# Patient Record
Sex: Female | Born: 1960 | Race: White | Hispanic: No | Marital: Single | Smoking: Never smoker
Health system: Southern US, Community
[De-identification: ages and names within clinical notes are randomized; demographics above are authoritative.]

## PROBLEM LIST (undated history)

## (undated) DIAGNOSIS — K219 Gastro-esophageal reflux disease without esophagitis: Secondary | ICD-10-CM

## (undated) DIAGNOSIS — I499 Cardiac arrhythmia, unspecified: Secondary | ICD-10-CM

## (undated) DIAGNOSIS — M1990 Unspecified osteoarthritis, unspecified site: Secondary | ICD-10-CM

## (undated) DIAGNOSIS — Z87442 Personal history of urinary calculi: Secondary | ICD-10-CM

## (undated) DIAGNOSIS — I10 Essential (primary) hypertension: Secondary | ICD-10-CM

## (undated) DIAGNOSIS — E119 Type 2 diabetes mellitus without complications: Secondary | ICD-10-CM

## (undated) HISTORY — PX: DILATION AND CURETTAGE OF UTERUS: SHX78

## (undated) HISTORY — PX: EYE SURGERY: SHX253

---

## 2004-01-28 ENCOUNTER — Other Ambulatory Visit: Payer: Self-pay

## 2005-03-11 ENCOUNTER — Ambulatory Visit: Payer: Self-pay

## 2005-04-15 ENCOUNTER — Ambulatory Visit: Payer: Self-pay

## 2005-12-22 ENCOUNTER — Other Ambulatory Visit: Payer: Self-pay

## 2005-12-22 ENCOUNTER — Emergency Department: Payer: Self-pay | Admitting: Unknown Physician Specialty

## 2006-08-26 ENCOUNTER — Ambulatory Visit: Payer: Self-pay | Admitting: Internal Medicine

## 2007-05-11 ENCOUNTER — Encounter (INDEPENDENT_AMBULATORY_CARE_PROVIDER_SITE_OTHER): Payer: Self-pay | Admitting: Gynecology

## 2007-05-11 ENCOUNTER — Ambulatory Visit: Payer: Self-pay | Admitting: Gynecology

## 2007-06-27 IMAGING — US ABDOMEN ULTRASOUND
1 series · 17 of 25 positions shown · non-contrast
Comparison: none

REASON FOR EXAM: Abdominal pain RUQ, evaluate gallbladder
COMMENTS:

[Series 1: abdomen ultrasound · 17 of 37 slices shown]
[im 1/37]
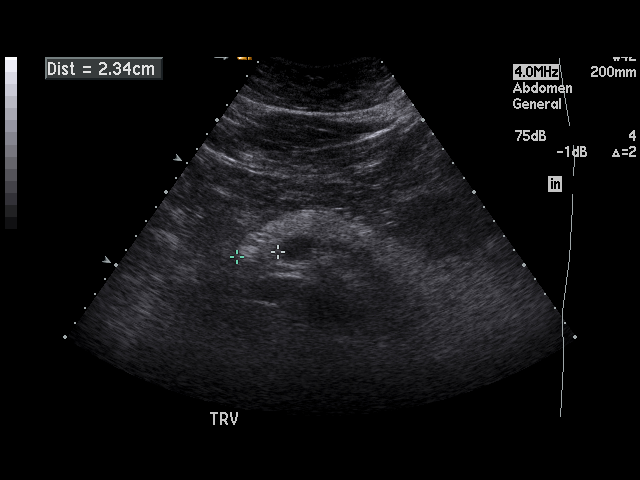
[im 4/37]
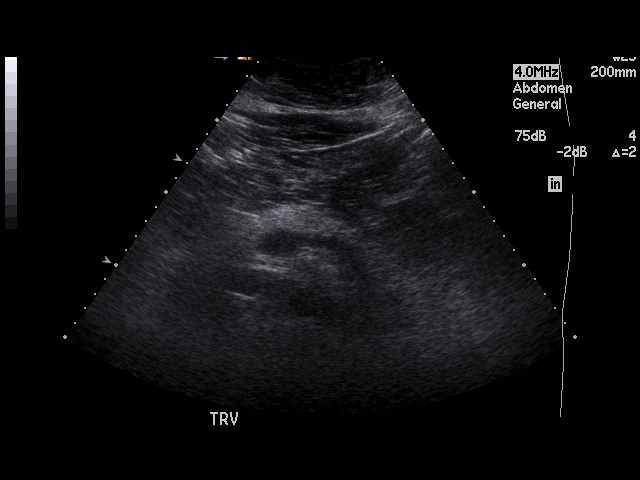
[im 5/37]
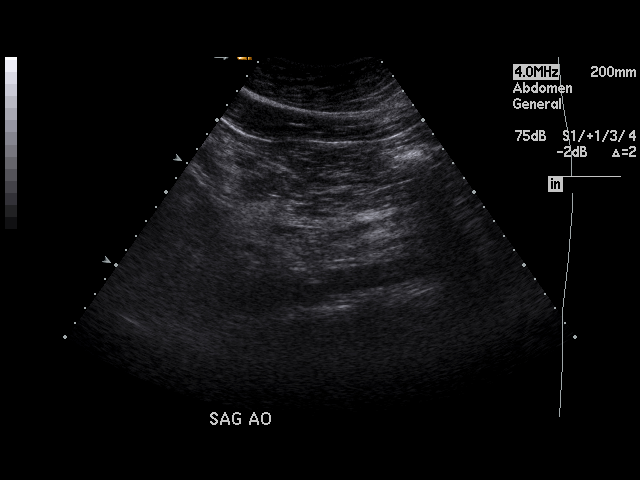
[im 8/37]
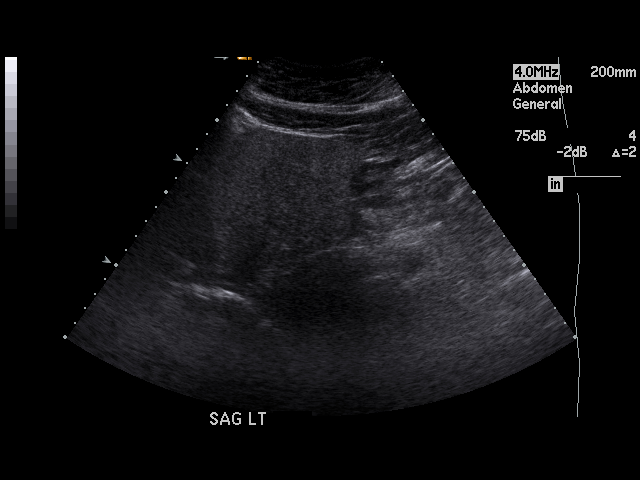
[im 10/37]
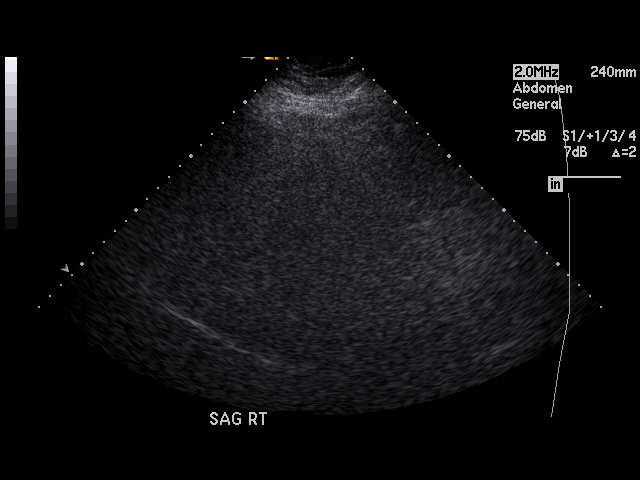
[im 13/37]
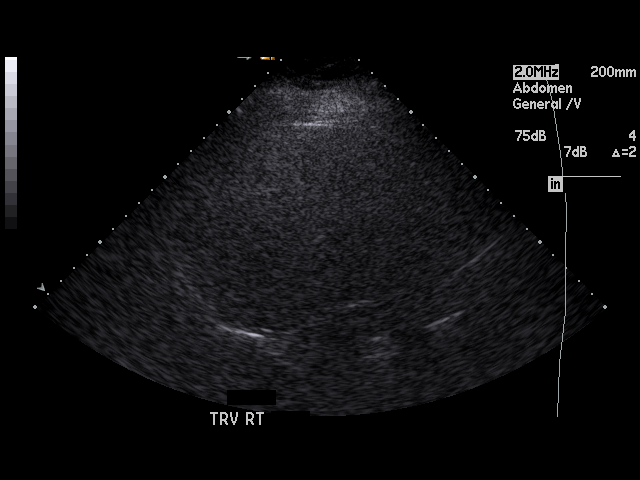
[im 14/37]
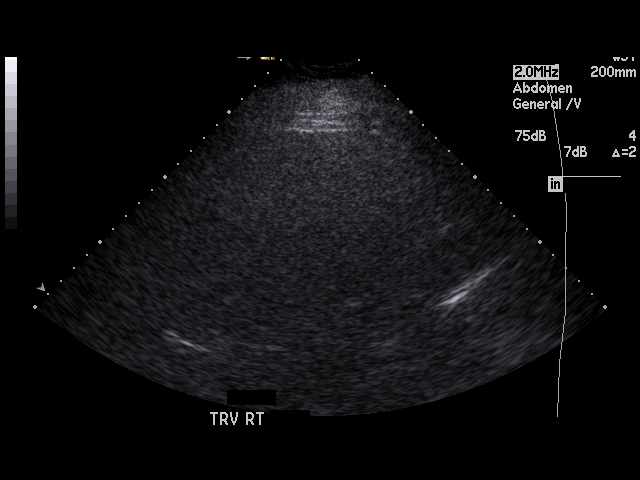
[im 17/37]
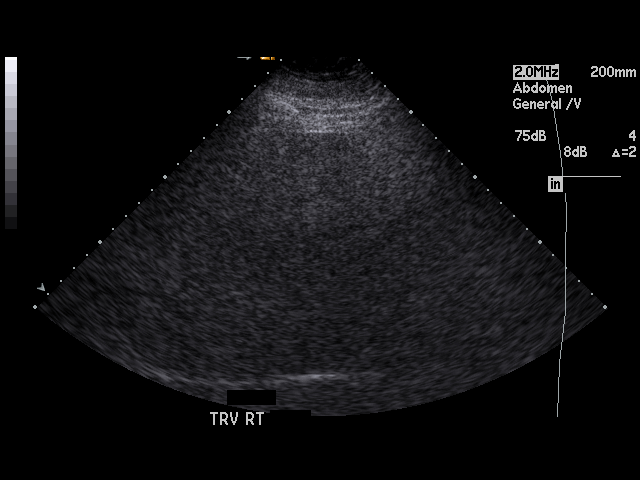
[im 19/37]
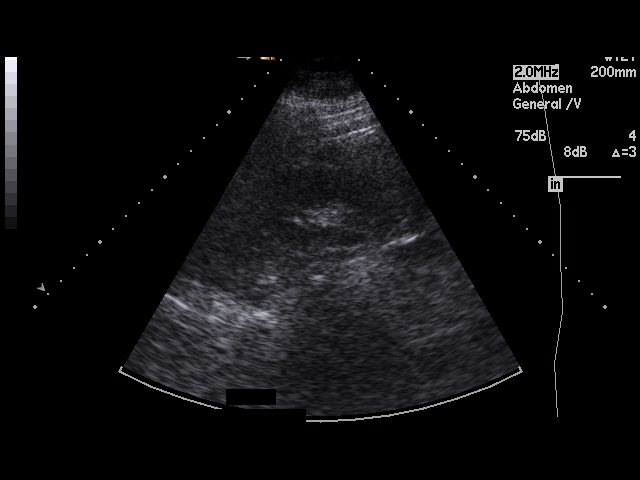
[im 20/37]
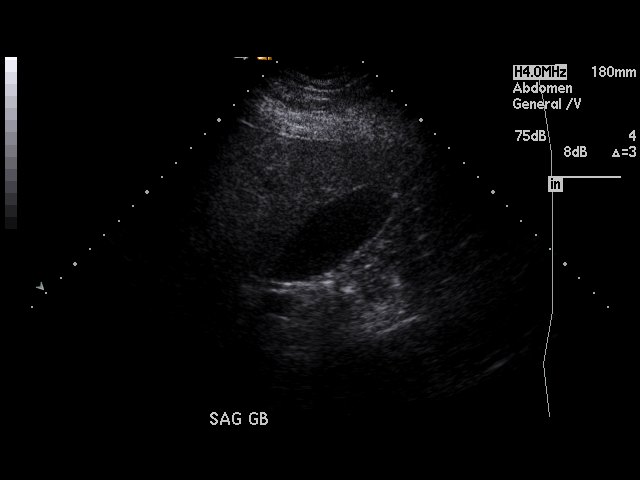
[im 23/37]
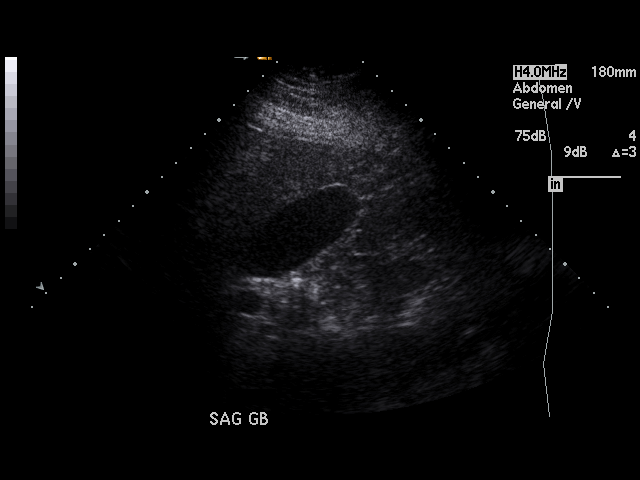
[im 25/37]
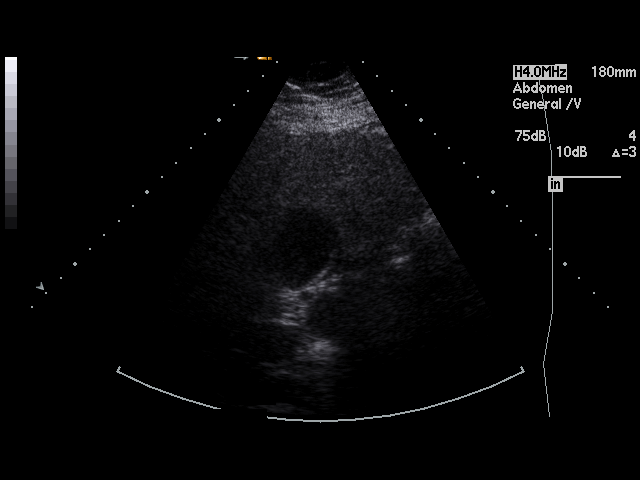
[im 28/37]
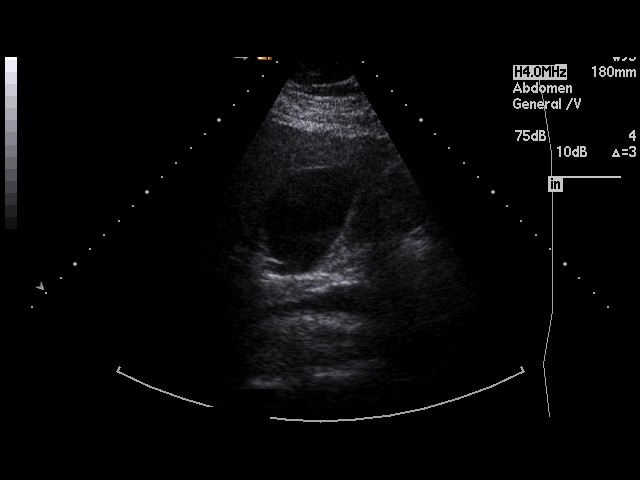
[im 29/37]
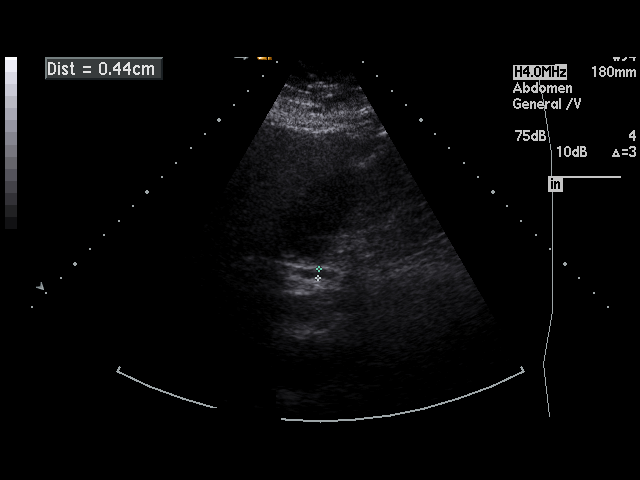
[im 32/37]
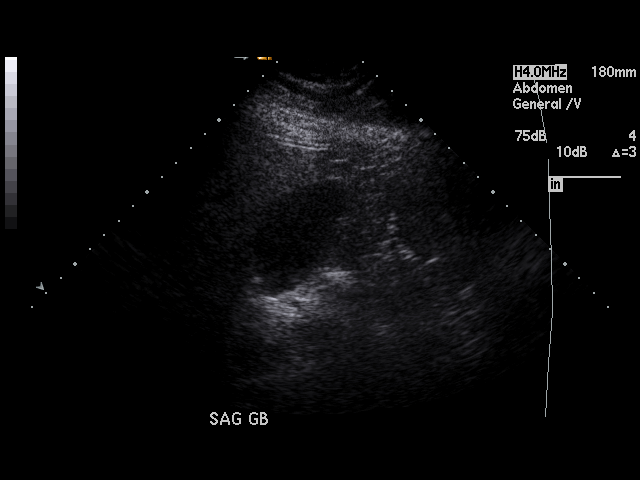
[im 34/37]
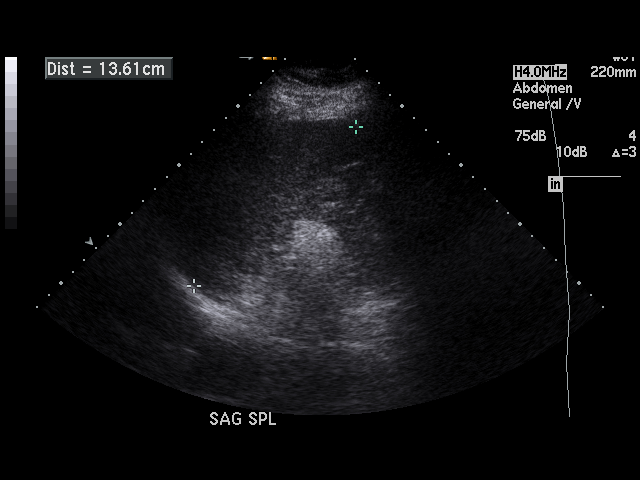
[im 37/37]
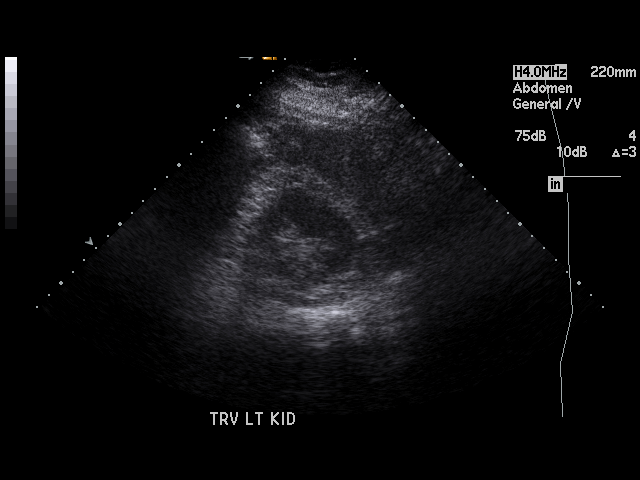

[17 of 25 positions shown; findings below may reference images not displayed]

PROCEDURE:     US  - US ABDOMEN GENERAL SURVEY  - March 11, 2005  [DATE]

RESULT:     The liver is dense, suspicious for fatty infiltration.  The
spleen is upper limits for normal in size.  The spleen measures 13.6 cm in
AP diameter.  The pancreas is normal in appearance.  No gallstones are seen.
 There is no thickening of the gallbladder wall.  Common bile duct measures
4.5 mm in diameter which is within normal limits.  The kidneys show no
hydronephrosis.
IMPRESSION: Probable fatty infiltration of the liver.

No gallstones are seen.

## 2007-08-30 ENCOUNTER — Ambulatory Visit: Payer: Self-pay | Admitting: Gynecology

## 2008-12-19 ENCOUNTER — Ambulatory Visit: Payer: Self-pay | Admitting: Internal Medicine

## 2009-11-13 ENCOUNTER — Ambulatory Visit: Payer: Self-pay

## 2009-11-18 ENCOUNTER — Ambulatory Visit: Payer: Self-pay | Admitting: Cardiovascular Disease

## 2009-11-19 ENCOUNTER — Ambulatory Visit: Payer: Self-pay

## 2009-12-18 ENCOUNTER — Encounter: Payer: Self-pay | Admitting: Gastroenterology

## 2010-01-14 ENCOUNTER — Ambulatory Visit: Payer: Self-pay | Admitting: Internal Medicine

## 2010-02-26 ENCOUNTER — Ambulatory Visit: Payer: Self-pay | Admitting: Internal Medicine

## 2010-07-29 NOTE — Medication Information (Signed)
Summary: Tax adviser   Imported By: Rosine Beat 12/18/2009 10:41:16  _____________________________________________________________________  External Attachment:    Type:   Image     Comment:   External Document

## 2012-06-13 IMAGING — CR DG ABDOMEN 2V
1 series · 3 of 3 positions shown · non-contrast
Comparison: none

REASON FOR EXAM: Re: rt side stones
COMMENTS:

[Series 1: view not recorded · 0.17mm/px · 3 of 3 slices shown]
[im 1/3]
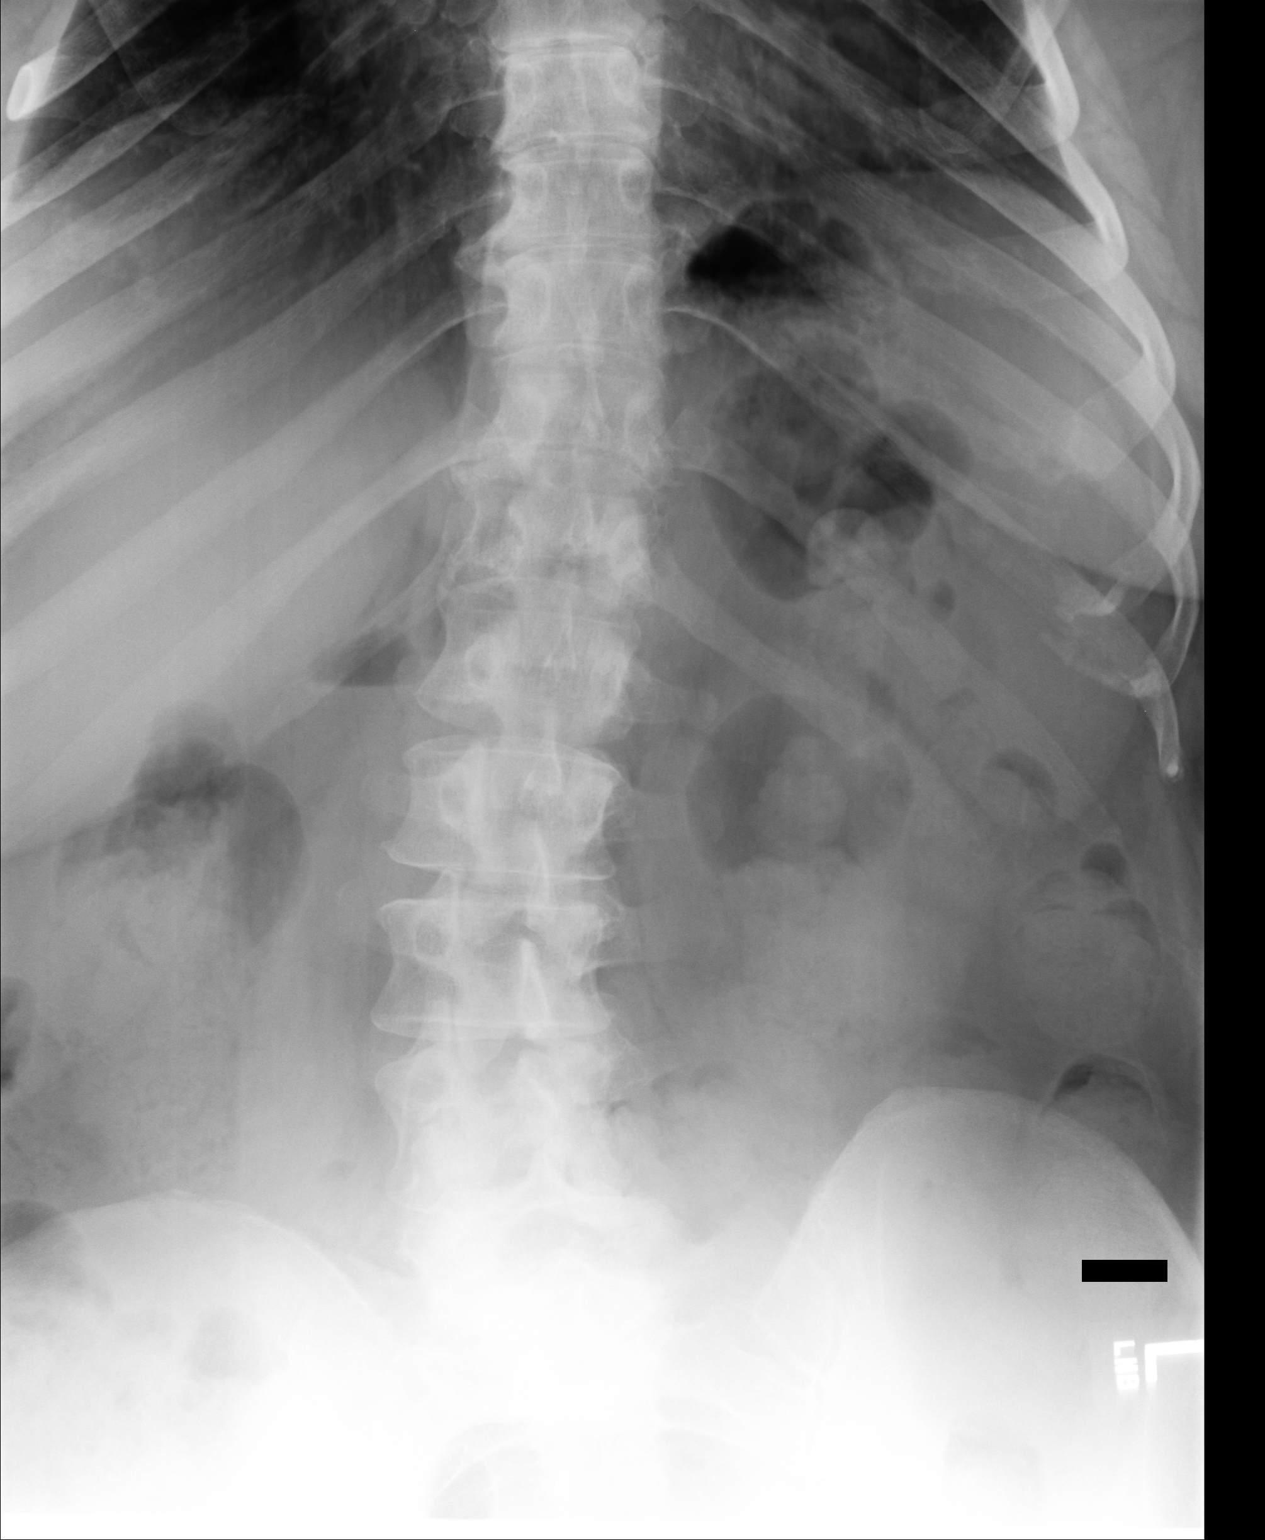
[im 2/3]
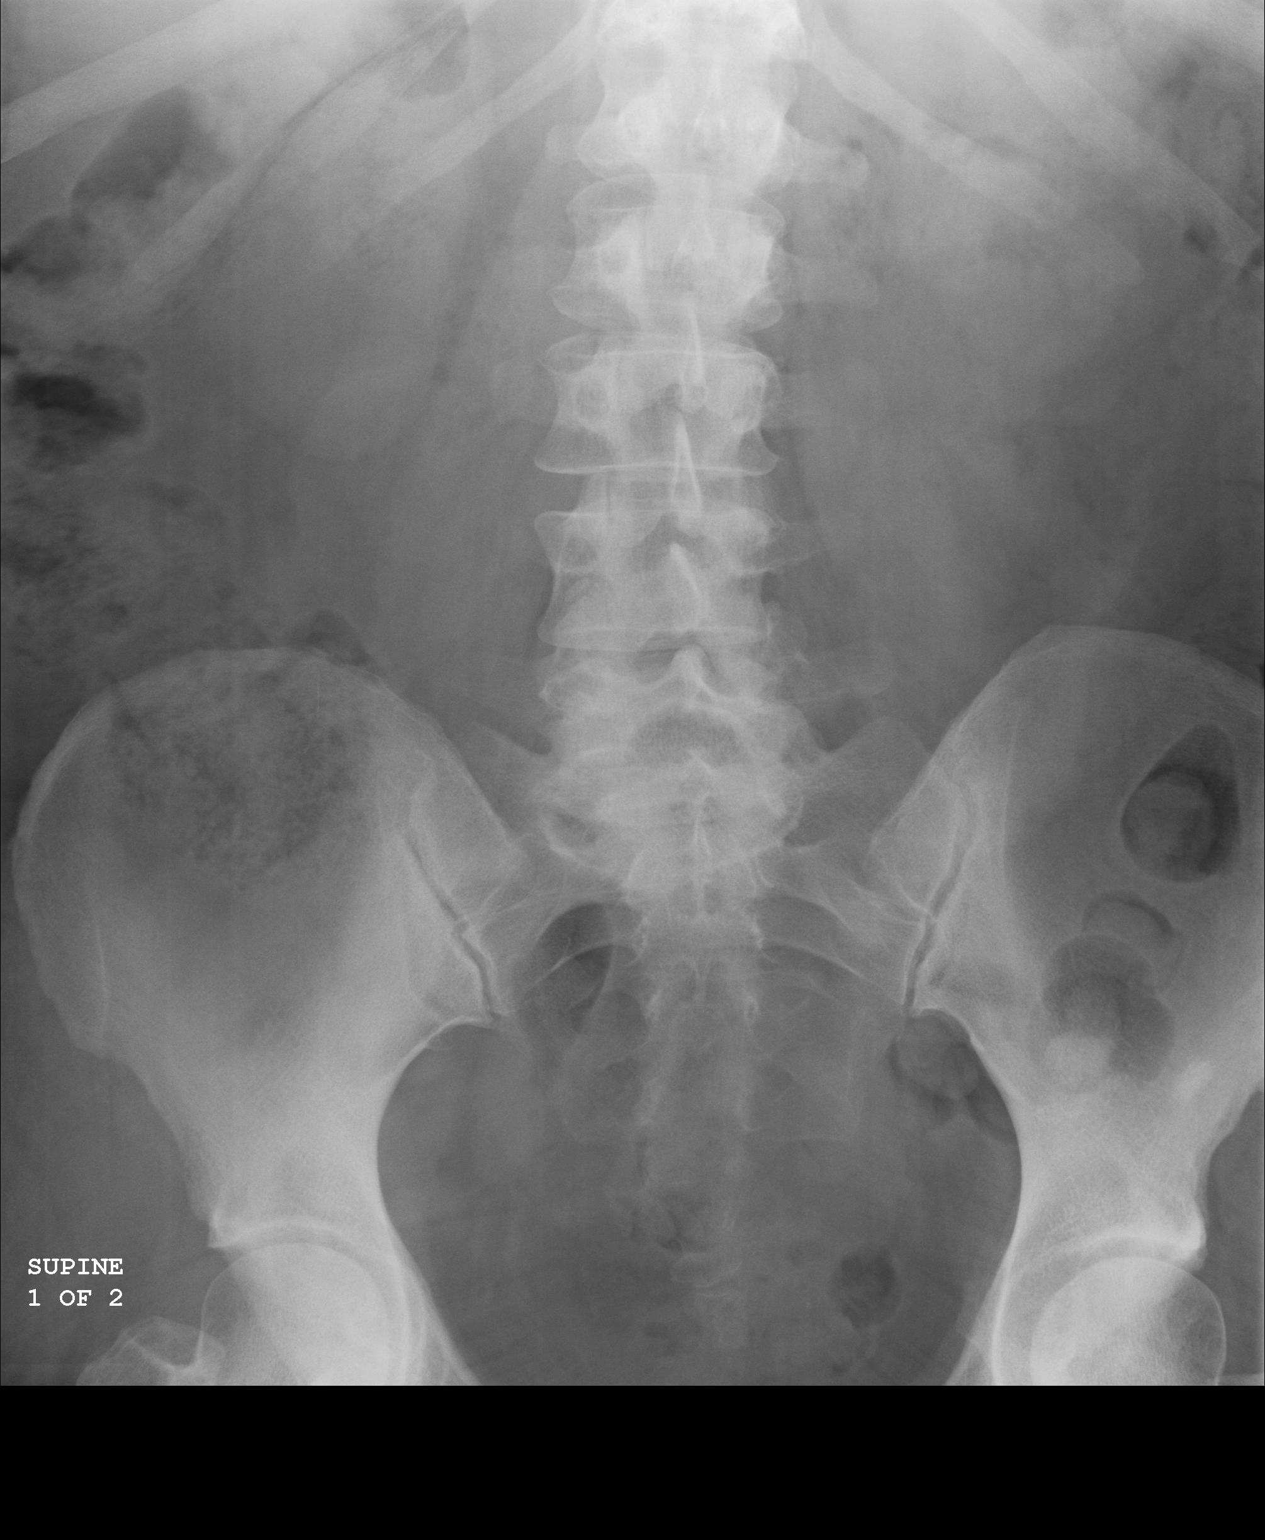
[im 3/3]
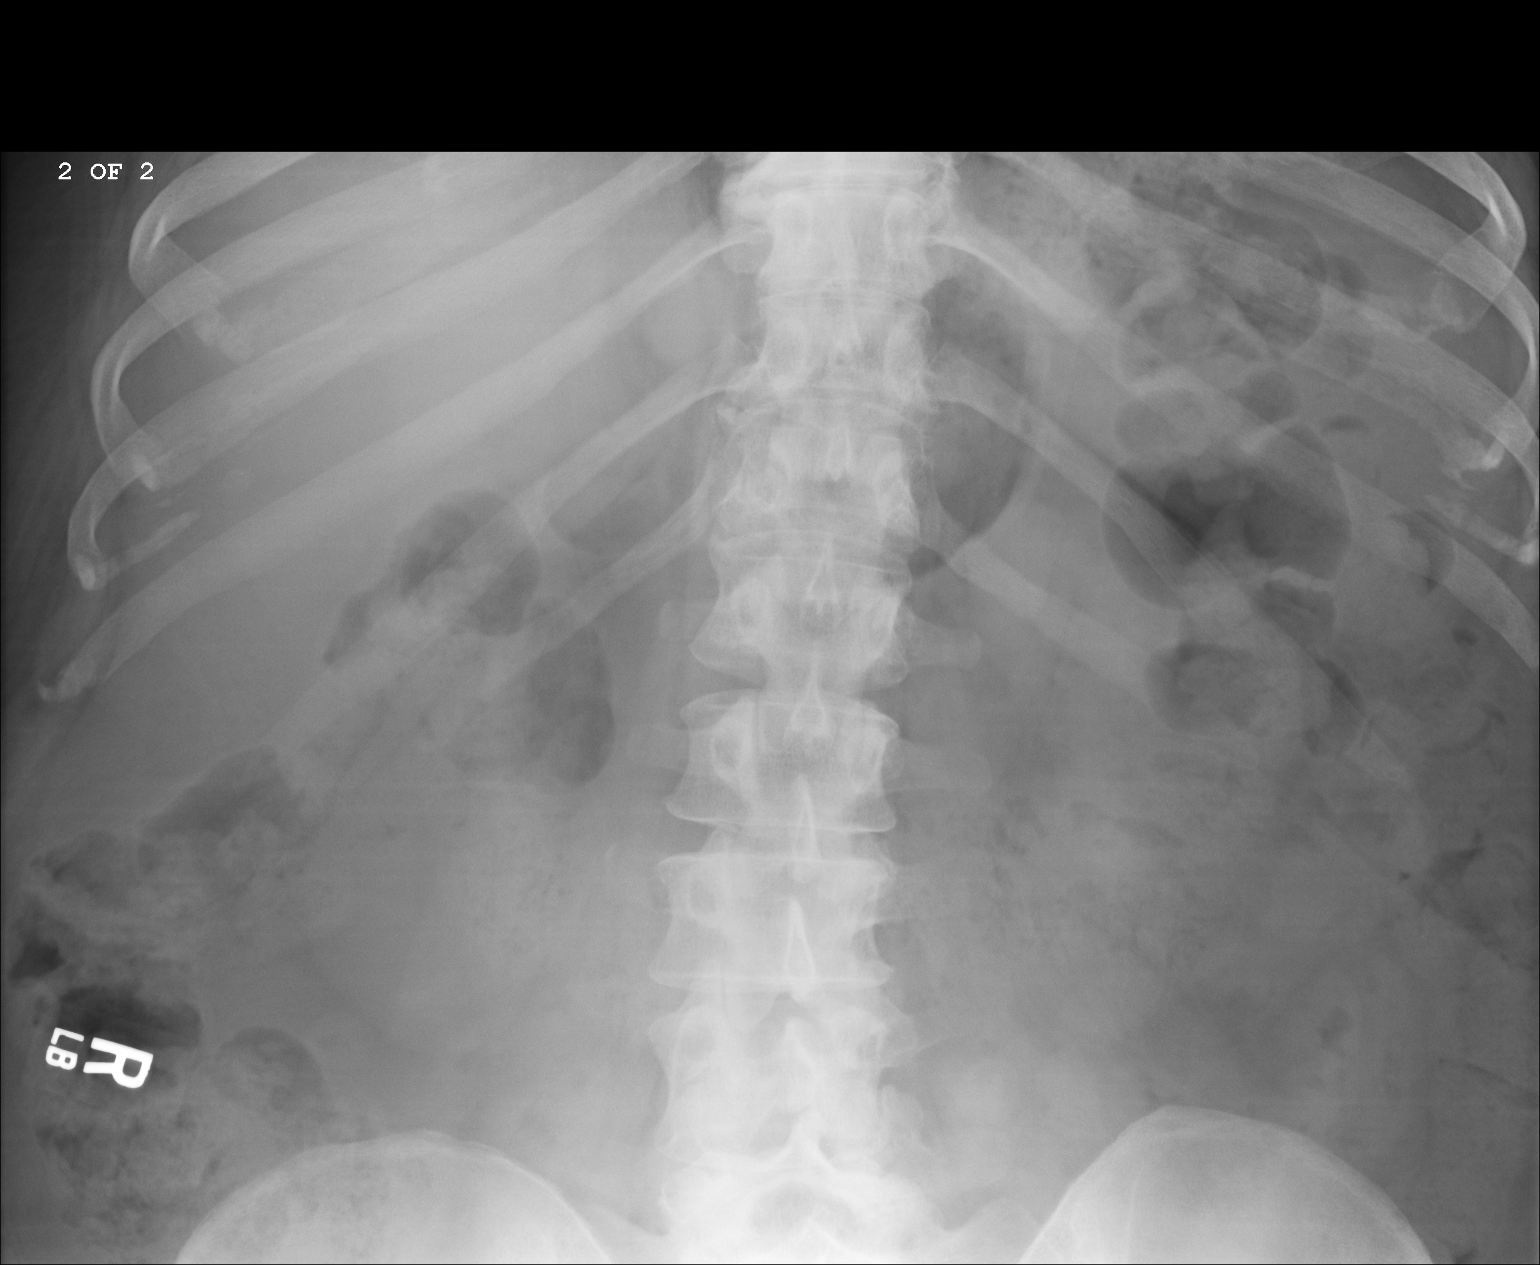

[3 of 3 positions shown; findings below may reference images not displayed]

PROCEDURE:     DXR - DXR ABDOMEN 2 V FLAT AND ERECT  - February 26, 2010  [DATE]

RESULT:     No subdiaphragmatic free air is seen. The bowel gas pattern is
normal. No significantly dilated loops of bowel are observed. There is a
moderate amount of fecal material in the colon. No abnormal intraabdominal
calcifications are seen. The osseous structures are normal in appearance.
The psoas margins are visualized bilaterally.
IMPRESSION: 1. No bowel obstruction or other acute change is identified.
2. There is a moderate amount of fecal material in the colon.
3. No definite renal or ureteral stones are identified on the current
routine radiographic series.

## 2020-07-08 ENCOUNTER — Other Ambulatory Visit: Payer: Self-pay | Admitting: Orthopedic Surgery

## 2020-07-08 ENCOUNTER — Other Ambulatory Visit: Payer: Self-pay

## 2020-07-08 ENCOUNTER — Other Ambulatory Visit
Admission: RE | Admit: 2020-07-08 | Discharge: 2020-07-08 | Disposition: A | Discharge status: Home / Self Care | Payer: BLUE CROSS/BLUE SHIELD | Source: Ambulatory Visit | Attending: Orthopedic Surgery | Admitting: Orthopedic Surgery

## 2020-07-08 ENCOUNTER — Encounter: Payer: Self-pay | Admitting: Urgent Care

## 2020-07-08 ENCOUNTER — Encounter: Payer: Self-pay | Admitting: Orthopedic Surgery

## 2020-07-08 DIAGNOSIS — E119 Type 2 diabetes mellitus without complications: Secondary | ICD-10-CM | POA: Diagnosis not present

## 2020-07-08 DIAGNOSIS — I10 Essential (primary) hypertension: Secondary | ICD-10-CM | POA: Insufficient documentation

## 2020-07-08 DIAGNOSIS — Z01818 Encounter for other preprocedural examination: Secondary | ICD-10-CM | POA: Insufficient documentation

## 2020-07-08 HISTORY — DX: Essential (primary) hypertension: I10

## 2020-07-08 HISTORY — DX: Gastro-esophageal reflux disease without esophagitis: K21.9

## 2020-07-08 HISTORY — DX: Type 2 diabetes mellitus without complications: E11.9

## 2020-07-08 HISTORY — DX: Personal history of urinary calculi: Z87.442

## 2020-07-08 HISTORY — DX: Cardiac arrhythmia, unspecified: I49.9

## 2020-07-08 HISTORY — DX: Unspecified osteoarthritis, unspecified site: M19.90

## 2020-07-08 LAB — CBC
HCT: 38.1 % (ref 36.0–46.0)
Hemoglobin: 12.1 g/dL (ref 12.0–15.0)
MCH: 24.8 pg — ABNORMAL LOW (ref 26.0–34.0)
MCHC: 31.8 g/dL (ref 30.0–36.0)
MCV: 78.2 fL — ABNORMAL LOW (ref 80.0–100.0)
Platelets: 211 10*3/uL (ref 150–400)
RBC: 4.87 MIL/uL (ref 3.87–5.11)
RDW: 14.2 % (ref 11.5–15.5)
WBC: 5.9 10*3/uL (ref 4.0–10.5)
nRBC: 0 % (ref 0.0–0.2)

## 2020-07-08 LAB — URINALYSIS, ROUTINE W REFLEX MICROSCOPIC
Bilirubin Urine: NEGATIVE
Glucose, UA: NEGATIVE mg/dL
Hgb urine dipstick: NEGATIVE
Ketones, ur: NEGATIVE mg/dL
Nitrite: NEGATIVE
Protein, ur: NEGATIVE mg/dL
Specific Gravity, Urine: 1.023 (ref 1.005–1.030)
pH: 5 (ref 5.0–8.0)

## 2020-07-08 LAB — BASIC METABOLIC PANEL
Anion gap: 14 (ref 5–15)
BUN: 25 mg/dL — ABNORMAL HIGH (ref 6–20)
CO2: 29 mmol/L (ref 22–32)
Calcium: 9.3 mg/dL (ref 8.9–10.3)
Chloride: 96 mmol/L — ABNORMAL LOW (ref 98–111)
Creatinine, Ser: 0.72 mg/dL (ref 0.44–1.00)
GFR, Estimated: >60 mL/min (ref >60)
Glucose, Bld: 174 mg/dL — ABNORMAL HIGH (ref 70–99)
Potassium: 3.3 mmol/L — ABNORMAL LOW (ref 3.5–5.1)
Sodium: 139 mmol/L (ref 135–145)

## 2020-07-08 LAB — TYPE AND SCREEN
ABO/RH(D): B POS
Antibody Screen: NEGATIVE

## 2020-07-08 LAB — PROTIME-INR
INR: 1 (ref 0.8–1.2)
Prothrombin Time: 13.2 seconds (ref 11.4–15.2)

## 2020-07-08 LAB — SURGICAL PCR SCREEN
MRSA, PCR: NEGATIVE
Staphylococcus aureus: NEGATIVE

## 2020-07-08 LAB — HEMOGLOBIN A1C
Hgb A1c MFr Bld: 6.3 % — ABNORMAL HIGH (ref 4.8–5.6)
Mean Plasma Glucose: 134.11 mg/dL

## 2020-07-08 LAB — APTT: aPTT: 30 seconds (ref 24–36)

## 2020-07-08 NOTE — H&P (Signed)
NAME: Sarah Le MRN:   [id number] DOB:   [birth year]     HISTORY AND PHYSICAL  CHIEF COMPLAINT:  Left hip pain  HISTORY:   Jasman Pfeifle Sheltonis a 60 y.o. female  with left  Hip Pain Patient complains of left hip pain. Onset of the symptoms was several years ago. Inciting event: known DJD. The patient reports the hip pain is worse with weight bearing. Associated symptoms: none. Aggravating symptoms include: any weight bearing and going up and down stairs. Patient has had no prior hip problems. Previous visits for this problem: multiple, this is a longstanding diagnosis. Last seen several weeks ago by me. Evaluation to date: plain films, which were abnormal  osteoarthritis. Treatment to date: OTC analgesics, which have been somewhat effective, prescription analgesics, which have been somewhat effective, home exercise program, which has been somewhat effective and physical therapy, which has been somewhat effective. Plan for left total hip replacement  PAST MEDICAL HISTORY:  No past medical history on file.  PAST SURGICAL HISTORY:    MEDICATIONS:  (Not in a hospital admission)   ALLERGIES:   Allergies  Allergen Reactions  . Mobic [Meloxicam]     Heart attack symptoms   . Sulfa Antibiotics Other (See Comments)    Causes menstruation     REVIEW OF SYSTEMS:   Negative except HPI  FAMILY HISTORY:  No family history on file.  SOCIAL HISTORY:   has no history on file for tobacco use, alcohol use, and drug use.  PHYSICAL EXAM:  General appearance: alert, cooperative and no distress Neck: no JVD and supple, symmetrical, trachea midline Resp: clear to auscultation bilaterally Cardio: regular rate and rhythm, S1, S2 normal, no murmur, click, rub or gallop GI: soft, non-tender; bowel sounds normal; no masses,  no organomegaly Extremities: extremities normal, atraumatic, no cyanosis or edema and Homans sign is negative, no sign of DVT Pulses: 2+ and symmetric Skin: Skin color, texture,  turgor normal. No rashes or lesions Neurologic: Alert and oriented X 3, normal strength and tone. Normal symmetric reflexes. Normal coordination and gait    LABORATORY STUDIES: No results for input(s): WBC, HGB, HCT, PLT in the last 72 hours.  No results for input(s): NA, K, CL, CO2, GLUCOSE, BUN, CREATININE, CALCIUM in the last 72 hours.  STUDIES/RESULTS:  No results found.  ASSESSMENT:  End stage osteoarthritis left hip        Active Problems:   * No active hospital problems. *    PLAN:  Left Primary Total Hip   Altamese Cabal 07/08/2020. 8:33 AM

## 2020-07-08 NOTE — Patient Instructions (Signed)
Your procedure is scheduled on: Wednesday 07/17/20.  Report to THE FIRST FLOOR REGISTRATION DESK IN THE MEDICAL MALL ON THE MORNING OF SURGERY FIRST, THEN YOU WILL CHECK IN AT THE SURGERY INFORMATION DESK LOCATED OUTSIDE THE SAME DAY SURGERY DEPARTMENT LOCATED ON 2ND FLOOR MEDICAL MALL ENTRANCE.  To find out your arrival time please call [phone] between 1PM - 3PM on Tuesday 07/16/20.   Remember: Instructions that are not followed completely may result in serious medical risk, up to and including death, or upon the discretion of your surgeon and anesthesiologist your surgery may need to be rescheduled.     __X__ 1. Do not eat food after midnight the night before your procedure.                 No gum chewing or hard candies. You may drink SUGAR FREE clear liquids up to 2 hours                 before you are scheduled to arrive for your surgery- DO NOT drink clear                 liquids within 2 hours of the start of your surgery.                 __X__2.  On the morning of surgery brush your teeth with toothpaste and water, you may rinse your mouth with mouthwash if you wish.  Do not swallow any toothpaste or mouthwash.    __X__ 3.  No Alcohol for 24 hours before or after surgery.  __X__ 4.  Do Not Smoke or use e-cigarettes For 24 Hours Prior to Your Surgery.                 Do not use any chewable tobacco products for at least 6 hours prior to                 surgery.  __X__5.  Notify your doctor if there is any change in your medical condition      (cold, fever, infections).      Do NOT wear jewelry, make-up, hairpins, clips or nail polish. Do NOT wear lotions, powders, or perfumes.  Do NOT shave 48 hours prior to surgery. Men may shave face and neck. Do NOT bring valuables to the hospital.     [facility] is not responsible for any belongings or valuables.   Contacts, dentures/partials or body piercings may not be worn into surgery. Bring a case for your contacts, glasses  or hearing aids, a denture cup will be supplied.   Patients discharged the day of surgery will not be allowed to drive home.     __X__ Take these medicines the morning of surgery with A SIP OF WATER:     1. metoprolol tartrate (LOPRESSOR)  2. omeprazole (PRILOSEC)    __X__ Use CHG Soap as directed.   __X__ Stop Metformin 2 days prior to surgery, Your last dose will be on Sunday 07/14/20.   __X__ Stop Blood Thinners: Aspirin. Your last dose will be on Wednesday 07/10/20.   __X__ Stop Anti-inflammatories 7 days before surgery such as Advil, Ibuprofen, Motrin, BC or Goodies Powder, Naprosyn, Naproxen, Aleve, Aspirin, Meloxicam. May take Tylenol if needed for pain or discomfort.   __X__Do not start taking any new herbal supplements or vitamins prior to your procedure.    Wear comfortable clothing (specific to your surgery type) to the hospital.  Plan for stool softeners for  home use; pain medications have a tendency to cause constipation. You can also help prevent constipation by eating foods high in fiber such as fruits and vegetables and drinking plenty of fluids as your diet allows.  After surgery, you can prevent lung complications by doing breathing exercises.Take deep breaths and cough every 1-2 hours. Your doctor may order a device called an Incentive Spirometer to help you take deep breaths.  Please call the Pre-Admissions Testing Department at [phone] if you have any questions about these instructions.

## 2020-07-15 ENCOUNTER — Other Ambulatory Visit: Payer: BLUE CROSS/BLUE SHIELD

## 2020-07-15 ENCOUNTER — Other Ambulatory Visit: Admission: RE | Admit: 2020-07-15 | Payer: BLUE CROSS/BLUE SHIELD | Source: Ambulatory Visit

## 2020-07-17 ENCOUNTER — Encounter: Admission: RE | Payer: Self-pay | Source: Home / Self Care

## 2020-07-17 ENCOUNTER — Ambulatory Visit
Admission: RE | Admit: 2020-07-17 | Payer: BLUE CROSS/BLUE SHIELD | Source: Home / Self Care | Admitting: Orthopedic Surgery

## 2020-07-17 SURGERY — ARTHROPLASTY, HIP, TOTAL, ANTERIOR APPROACH
Anesthesia: Spinal | Site: Hip | Laterality: Left

## 2023-02-19 DIAGNOSIS — B372 Candidiasis of skin and nail: Secondary | ICD-10-CM | POA: Diagnosis not present

## 2023-02-19 DIAGNOSIS — I10 Essential (primary) hypertension: Secondary | ICD-10-CM | POA: Diagnosis not present

## 2023-02-19 DIAGNOSIS — N3941 Urge incontinence: Secondary | ICD-10-CM | POA: Diagnosis not present

## 2023-02-19 DIAGNOSIS — E119 Type 2 diabetes mellitus without complications: Secondary | ICD-10-CM | POA: Diagnosis not present

## 2023-05-21 DIAGNOSIS — I10 Essential (primary) hypertension: Secondary | ICD-10-CM | POA: Diagnosis not present

## 2023-05-21 DIAGNOSIS — E119 Type 2 diabetes mellitus without complications: Secondary | ICD-10-CM | POA: Diagnosis not present

## 2023-05-21 DIAGNOSIS — N3941 Urge incontinence: Secondary | ICD-10-CM | POA: Diagnosis not present

## 2023-06-16 DIAGNOSIS — I10 Essential (primary) hypertension: Secondary | ICD-10-CM | POA: Diagnosis not present

## 2023-06-16 DIAGNOSIS — E119 Type 2 diabetes mellitus without complications: Secondary | ICD-10-CM | POA: Diagnosis not present

## 2023-06-16 DIAGNOSIS — N3941 Urge incontinence: Secondary | ICD-10-CM | POA: Diagnosis not present
